# Patient Record
Sex: Female | Born: 1965 | Hispanic: No | Marital: Married | Smoking: Never smoker
Health system: Southern US, Community
[De-identification: ages and names within clinical notes are randomized; demographics above are authoritative.]

---

## 2015-03-23 ENCOUNTER — Encounter (HOSPITAL_COMMUNITY): Payer: Self-pay | Admitting: Emergency Medicine

## 2015-03-23 ENCOUNTER — Emergency Department (INDEPENDENT_AMBULATORY_CARE_PROVIDER_SITE_OTHER): Payer: BLUE CROSS/BLUE SHIELD

## 2015-03-23 ENCOUNTER — Emergency Department (INDEPENDENT_AMBULATORY_CARE_PROVIDER_SITE_OTHER)
Admission: EM | Admit: 2015-03-23 | Discharge: 2015-03-23 | Disposition: A | Discharge status: Home / Self Care | Payer: BLUE CROSS/BLUE SHIELD | Source: Home / Self Care | Attending: Family Medicine | Admitting: Family Medicine

## 2015-03-23 DIAGNOSIS — J40 Bronchitis, not specified as acute or chronic: Secondary | ICD-10-CM | POA: Diagnosis not present

## 2015-03-23 MED ORDER — PREDNISONE 10 MG PO TABS: 30.0000 mg | ORAL_TABLET | Freq: Every day | ORAL | Status: AC

## 2015-03-23 MED ORDER — GUAIFENESIN-CODEINE 100-10 MG/5ML PO SOLN: 5.0000 mL | Freq: Every evening | ORAL | Status: AC | PRN

## 2015-03-23 NOTE — ED Notes (Signed)
C.o cough States cough is not productive States OTC meds was used as tx States she does have a itchy throat Denies any sneezing, earache, or nasal congestion

## 2015-03-23 NOTE — ED Provider Notes (Signed)
Diana Marshall is a 49 y.o. female who presents to Urgent Care today for cough. Patient has a nonproductive bothersome cough for 2 weeks associated with itchy scratchy throat and some runny nose. No fevers chills vomiting or diarrhea. She's tried some over-the-counter medications which did not help yet.   History reviewed. No pertinent past medical history. No past surgical history on file. History  Substance Use Topics  . Smoking status: Not on file  . Smokeless tobacco: Not on file  . Alcohol Use: Not on file   ROS as above Medications: No current facility-administered medications for this encounter.   Current Outpatient Prescriptions  Medication Sig Dispense Refill  . guaiFENesin-codeine 100-10 MG/5ML syrup Take 5 mLs by mouth at bedtime as needed for cough. 120 mL 0  . predniSONE (DELTASONE) 10 MG tablet Take 3 tablets (30 mg total) by mouth daily. 15 tablet 0   No Known Allergies   Exam:  BP 105/68 mmHg  Pulse 75  Temp(Src) 97.6 F (36.4 C) (Oral)  Resp 16  SpO2 98% Gen: Well NAD HEENT: EOMI,  MMM Lungs: Normal work of breathing. CTABL frequent coughing Heart: RRR no MRG Abd: NABS, Soft. Nondistended, Nontender Exts: Brisk capillary refill, warm and well perfused.   No results found for this or any previous visit (from the past 24 hour(s)). Dg Chest 2 View  03/23/2015   CLINICAL DATA:  Cough for 2 weeks.  EXAM: CHEST  2 VIEW  COMPARISON:  None.  FINDINGS: Cardiopericardial silhouette within normal limits. Mediastinal contours appear normal. No airspace disease or pleural effusion. No pneumothorax.  IMPRESSION: No active cardiopulmonary disease.   Electronically Signed   By: Andreas NewportGeoffrey  Lamke M.D.   On: 03/23/2015 19:45    Assessment and Plan: 49 y.o. female with bronchitis treat with prednisone and codeine cough syrup  Discussed warning signs or symptoms. Please see discharge instructions. Patient expresses understanding.     Rodolph BongEvan S Renaye Janicki, MD 03/23/15 [phone]

## 2015-03-23 NOTE — Discharge Instructions (Signed)
Thank you for coming in today. °Call or go to the emergency room if you get worse, have trouble breathing, have chest pains, or palpitations.  ° °Acute Bronchitis °Bronchitis is inflammation of the airways that extend from the windpipe into the lungs (bronchi). The inflammation often causes mucus to develop. This leads to a cough, which is the most common symptom of bronchitis.  °In acute bronchitis, the condition usually develops suddenly and goes away over time, usually in a couple weeks. Smoking, allergies, and asthma can make bronchitis worse. Repeated episodes of bronchitis may cause further lung problems.  °CAUSES °Acute bronchitis is most often caused by the same virus that causes a cold. The virus can spread from person to person (contagious) through coughing, sneezing, and touching contaminated objects. °SIGNS AND SYMPTOMS  °· Cough.   °· Fever.   °· Coughing up mucus.   °· Body aches.   °· Chest congestion.   °· Chills.   °· Shortness of breath.   °· Sore throat.   °DIAGNOSIS  °Acute bronchitis is usually diagnosed through a physical exam. Your health care provider will also ask you questions about your medical history. Tests, such as chest X-rays, are sometimes done to rule out other conditions.  °TREATMENT  °Acute bronchitis usually goes away in a couple weeks. Oftentimes, no medical treatment is necessary. Medicines are sometimes given for relief of fever or cough. Antibiotic medicines are usually not needed but may be prescribed in certain situations. In some cases, an inhaler may be recommended to help reduce shortness of breath and control the cough. A cool mist vaporizer may also be used to help thin bronchial secretions and make it easier to clear the chest.  °HOME CARE INSTRUCTIONS °· Get plenty of rest.   °· Drink enough fluids to keep your urine clear or pale yellow (unless you have a medical condition that requires fluid restriction). Increasing fluids may help thin your respiratory secretions  (sputum) and reduce chest congestion, and it will prevent dehydration.   °· Take medicines only as directed by your health care provider. °· If you were prescribed an antibiotic medicine, finish it all even if you start to feel better. °· Avoid smoking and secondhand smoke. Exposure to cigarette smoke or irritating chemicals will make bronchitis worse. If you are a smoker, consider using nicotine gum or skin patches to help control withdrawal symptoms. Quitting smoking will help your lungs heal faster.   °· Reduce the chances of another bout of acute bronchitis by washing your hands frequently, avoiding people with cold symptoms, and trying not to touch your hands to your mouth, nose, or eyes.   °· Keep all follow-up visits as directed by your health care provider.   °SEEK MEDICAL CARE IF: °Your symptoms do not improve after 1 week of treatment.  °SEEK IMMEDIATE MEDICAL CARE IF: °· You develop an increased fever or chills.   °· You have chest pain.   °· You have severe shortness of breath. °· You have bloody sputum.   °· You develop dehydration. °· You faint or repeatedly feel like you are going to pass out. °· You develop repeated vomiting. °· You develop a severe headache. °MAKE SURE YOU:  °· Understand these instructions. °· Will watch your condition. °· Will get help right away if you are not doing well or get worse. °Document Released: 12/24/2004 Document Revised: 04/02/2014 Document Reviewed: 05/09/2013 °[employer]® Patient Information ©2015 [employer]. This information is not intended to replace advice given to you by your health care provider. Make sure you discuss any questions you have with your   health care provider. ° °

## 2016-10-02 ENCOUNTER — Encounter (HOSPITAL_COMMUNITY): Payer: Self-pay

## 2016-10-02 ENCOUNTER — Emergency Department (HOSPITAL_COMMUNITY): Payer: No Typology Code available for payment source

## 2016-10-02 ENCOUNTER — Emergency Department (HOSPITAL_COMMUNITY)
Admission: EM | Admit: 2016-10-02 | Discharge: 2016-10-02 | Disposition: A | Discharge status: Home / Self Care | Payer: No Typology Code available for payment source | Attending: Emergency Medicine | Admitting: Emergency Medicine

## 2016-10-02 DIAGNOSIS — Y92410 Unspecified street and highway as the place of occurrence of the external cause: Secondary | ICD-10-CM | POA: Diagnosis not present

## 2016-10-02 DIAGNOSIS — Y999 Unspecified external cause status: Secondary | ICD-10-CM | POA: Insufficient documentation

## 2016-10-02 DIAGNOSIS — Y939 Activity, unspecified: Secondary | ICD-10-CM | POA: Insufficient documentation

## 2016-10-02 DIAGNOSIS — S299XXA Unspecified injury of thorax, initial encounter: Secondary | ICD-10-CM | POA: Diagnosis not present

## 2016-10-02 DIAGNOSIS — M546 Pain in thoracic spine: Secondary | ICD-10-CM

## 2016-10-02 DIAGNOSIS — R079 Chest pain, unspecified: Secondary | ICD-10-CM | POA: Diagnosis not present

## 2016-10-02 MED ORDER — ACETAMINOPHEN 325 MG PO TABS
650.0000 mg | ORAL_TABLET | Freq: Once | ORAL | Status: AC
  Administered 2016-10-02: 650 mg via ORAL
  Filled 2016-10-02: qty 2

## 2016-10-02 MED ORDER — NAPROXEN 250 MG PO TABS: 250.0000 mg | ORAL_TABLET | Freq: Two times a day (BID) | ORAL | 0 refills | Status: AC
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
## 2016-10-06 ENCOUNTER — Emergency Department (HOSPITAL_COMMUNITY)
Admission: EM | Admit: 2016-10-06 | Discharge: 2016-10-06 | Disposition: A | Discharge status: Home / Self Care | Payer: BLUE CROSS/BLUE SHIELD | Attending: Emergency Medicine | Admitting: Emergency Medicine

## 2016-10-06 ENCOUNTER — Emergency Department (HOSPITAL_COMMUNITY): Payer: BLUE CROSS/BLUE SHIELD

## 2016-10-06 ENCOUNTER — Encounter (HOSPITAL_COMMUNITY): Payer: Self-pay | Admitting: Emergency Medicine

## 2016-10-06 DIAGNOSIS — R0789 Other chest pain: Secondary | ICD-10-CM

## 2016-10-06 DIAGNOSIS — R072 Precordial pain: Secondary | ICD-10-CM | POA: Diagnosis not present

## 2016-10-06 DIAGNOSIS — M546 Pain in thoracic spine: Secondary | ICD-10-CM | POA: Diagnosis not present

## 2016-10-06 DIAGNOSIS — R079 Chest pain, unspecified: Secondary | ICD-10-CM | POA: Diagnosis present

## 2016-10-06 LAB — BASIC METABOLIC PANEL
Anion gap: 10 (ref 5–15)
BUN: 6 mg/dL (ref 6–20)
CALCIUM: 9.4 mg/dL (ref 8.9–10.3)
CO2: 25 mmol/L (ref 22–32)
CREATININE: 0.68 mg/dL (ref 0.44–1.00)
Chloride: 106 mmol/L (ref 101–111)
GFR calc Af Amer: >60 mL/min (ref >60)
Glucose, Bld: 73 mg/dL (ref 65–99)
Potassium: 3.5 mmol/L (ref 3.5–5.1)
Sodium: 141 mmol/L (ref 135–145)

## 2016-10-06 LAB — CBC
HCT: 41.1 % (ref 36.0–46.0)
Hemoglobin: 14.2 g/dL (ref 12.0–15.0)
MCH: 29.6 pg (ref 26.0–34.0)
MCHC: 34.5 g/dL (ref 30.0–36.0)
MCV: 85.6 fL (ref 78.0–100.0)
Platelets: 257 10*3/uL (ref 150–400)
RBC: 4.8 MIL/uL (ref 3.87–5.11)
RDW: 12.1 % (ref 11.5–15.5)
WBC: 7 10*3/uL (ref 4.0–10.5)

## 2016-10-06 LAB — I-STAT TROPONIN, ED: Troponin i, poc: 0 ng/mL (ref 0.00–0.08)
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
## 2016-10-06 NOTE — Discharge Instructions (Signed)
Take naprosyn (2 tablets twice a day with food) as directed for inflammation and pain, with tylenol for breakthrough pain. Use heat to areas of soreness, no more than 20 minutes at a time every hour. Expect to be sore for the next few days and follow up with [facility] in the next 1-2 weeks for recheck of ongoing symptoms and to establish medical care. Return to ER for emergent changing or worsening of symptoms.
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
## 2017-09-07 DIAGNOSIS — Z23 Encounter for immunization: Secondary | ICD-10-CM | POA: Diagnosis not present
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
## 2018-05-10 DIAGNOSIS — H9202 Otalgia, left ear: Secondary | ICD-10-CM | POA: Diagnosis not present

## 2018-05-10 DIAGNOSIS — E049 Nontoxic goiter, unspecified: Secondary | ICD-10-CM | POA: Diagnosis not present

## 2018-05-24 DIAGNOSIS — H7322 Unspecified myringitis, left ear: Secondary | ICD-10-CM | POA: Diagnosis not present

## 2018-06-28 DIAGNOSIS — H7322 Unspecified myringitis, left ear: Secondary | ICD-10-CM | POA: Diagnosis not present

## 2020-05-04 DIAGNOSIS — R05 Cough: Secondary | ICD-10-CM | POA: Diagnosis not present

## 2020-05-04 DIAGNOSIS — Z03818 Encounter for observation for suspected exposure to other biological agents ruled out: Secondary | ICD-10-CM | POA: Diagnosis not present
